# Patient Record
Sex: Female | Born: 2005
Health system: Southern US, Community
[De-identification: ages and names within clinical notes are randomized; demographics above are authoritative.]

## PROBLEM LIST (undated history)

## (undated) DIAGNOSIS — J45909 Unspecified asthma, uncomplicated: Secondary | ICD-10-CM

---
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
## 2023-10-03 ENCOUNTER — Other Ambulatory Visit: Payer: Self-pay

## 2023-10-03 MED ORDER — BUDESONIDE-FORMOTEROL FUMARATE 160-4.5 MCG/ACT IN AERO: 2.0000 | INHALATION_SPRAY | Freq: Two times a day (BID) | RESPIRATORY_TRACT | 3 refills | Status: DC

## 2023-10-03 NOTE — Telephone Encounter (Signed)
 Chart reviewed. Rx refilled. Requesting patient to be seen.

## 2024-02-01 ENCOUNTER — Other Ambulatory Visit: Payer: Self-pay | Admitting: Family Medicine

## 2024-02-03 NOTE — Telephone Encounter (Signed)
 Chart reviewed. Rx refilled.

## 2024-02-17 ENCOUNTER — Ambulatory Visit: Admitting: Family Medicine

## 2024-04-14 ENCOUNTER — Ambulatory Visit: Payer: Self-pay | Admitting: Family Medicine

## 2024-04-24 ENCOUNTER — Ambulatory Visit: Admitting: Family Medicine

## 2024-04-24 NOTE — Progress Notes (Unsigned)
" ° ° °  SUBJECTIVE:   CHIEF COMPLAINT / HPI:   Asthma  Mood  PERTINENT  PMH / PSH: ***  OBJECTIVE:   There were no vitals taken for this visit.  ***  ASSESSMENT/PLAN:   Assessment & Plan      Damien Cassis, MD [facility]  "
